# Patient Record
Sex: Male | Born: 1985 | Race: Black or African American | Hispanic: No | Marital: Single | Smoking: Current every day smoker
Health system: Southern US, Community
[De-identification: ages and names within clinical notes are randomized; demographics above are authoritative.]

---

## 2008-02-02 ENCOUNTER — Emergency Department (HOSPITAL_COMMUNITY): Admission: EM | Admit: 2008-02-02 | Discharge: 2008-02-03 | Payer: Self-pay | Admitting: Emergency Medicine

## 2013-09-15 ENCOUNTER — Emergency Department (HOSPITAL_BASED_OUTPATIENT_CLINIC_OR_DEPARTMENT_OTHER)
Admission: EM | Admit: 2013-09-15 | Discharge: 2013-09-15 | Disposition: A | Discharge status: Home / Self Care | Payer: Self-pay | Attending: Emergency Medicine | Admitting: Emergency Medicine

## 2013-09-15 ENCOUNTER — Encounter (HOSPITAL_BASED_OUTPATIENT_CLINIC_OR_DEPARTMENT_OTHER): Payer: Self-pay | Admitting: Emergency Medicine

## 2013-09-15 ENCOUNTER — Emergency Department (HOSPITAL_BASED_OUTPATIENT_CLINIC_OR_DEPARTMENT_OTHER): Payer: Self-pay

## 2013-09-15 DIAGNOSIS — Y9289 Other specified places as the place of occurrence of the external cause: Secondary | ICD-10-CM | POA: Insufficient documentation

## 2013-09-15 DIAGNOSIS — R296 Repeated falls: Secondary | ICD-10-CM | POA: Insufficient documentation

## 2013-09-15 DIAGNOSIS — S6390XA Sprain of unspecified part of unspecified wrist and hand, initial encounter: Secondary | ICD-10-CM | POA: Insufficient documentation

## 2013-09-15 DIAGNOSIS — F17200 Nicotine dependence, unspecified, uncomplicated: Secondary | ICD-10-CM | POA: Insufficient documentation

## 2013-09-15 DIAGNOSIS — S6990XA Unspecified injury of unspecified wrist, hand and finger(s), initial encounter: Secondary | ICD-10-CM | POA: Insufficient documentation

## 2013-09-15 DIAGNOSIS — Y9389 Activity, other specified: Secondary | ICD-10-CM | POA: Insufficient documentation

## 2013-09-15 DIAGNOSIS — S6391XA Sprain of unspecified part of right wrist and hand, initial encounter: Secondary | ICD-10-CM

## 2013-09-15 NOTE — ED Provider Notes (Signed)
CSN: [id number]     Arrival date & time 09/15/13  1838 History   First MD Initiated Contact with Patient 09/15/13 1844     Chief Complaint  Patient presents with  . Hand Injury     (Consider location/radiation/quality/duration/timing/severity/associated sxs/prior Treatment) HPI Comments: Patient is a 28 year old male who presents to the emergency department with his mother complaining of right hand pain x4 days. Patient reports he was at a cookout 4 days ago when he fell onto his right hand. States the pain has been constant, 7/10 and he has noticed slight swelling. No aggravating or alleviating factors. Denies numbness or tingling.  Patient is a 28 y.o. male presenting with hand injury. The history is provided by the patient and a parent.  Hand Injury   History reviewed. No pertinent past medical history. History reviewed. No pertinent past surgical history. History reviewed. No pertinent family history. History  Substance Use Topics  . Smoking status: Current Every Day Smoker -- 1.00 packs/day    Types: Cigarettes  . Smokeless tobacco: Not on file  . Alcohol Use: No    Review of Systems  Constitutional: Negative.   HENT: Negative.   Cardiovascular: Negative.   Musculoskeletal:       + R hand pain and swelling.  Neurological: Negative for numbness.      Allergies  Review of patient's allergies indicates no known allergies.  Home Medications   Prior to Admission medications   Not on File   BP 128/85  Temp(Src) 98 F (36.7 C) (Oral)  Resp 16  Ht 6' (1.829 m)  Wt 160 lb (72.576 kg)  BMI 21.70 kg/m2  SpO2 99% Physical Exam  Nursing note and vitals reviewed. Constitutional: He is oriented to person, place, and time. He appears well-developed and well-nourished. No distress.  HENT:  Head: Normocephalic and atraumatic.  Eyes: Conjunctivae and EOM are normal.  Neck: Normal range of motion. Neck supple.  Cardiovascular: Normal rate, regular rhythm and normal heart  sounds.   Pulmonary/Chest: Effort normal and breath sounds normal.  Musculoskeletal:  Tender to palpation over second and third metacarpal of right hand with mild overlying swelling. Full range of motion. Cap refill < 3 seconds.  Neurological: He is alert and oriented to person, place, and time.  Skin: Skin is warm and dry.  Psychiatric: He has a normal mood and affect. His behavior is normal.    ED Course  Procedures (including critical care time) Labs Review Labs Reviewed - No data to display  Imaging Review Dg Hand Complete Right  09/15/2013   CLINICAL DATA:  Hand injury  EXAM: RIGHT HAND - COMPLETE 3+ VIEW  COMPARISON:  None.  FINDINGS: There is soft tissue swelling about the dorsum of the hand. This finding is without associated fracture or dislocation. Joint spaces are preserved. No erosions. No evidence of chondrocalcinosis. No radiopaque foreign body.  IMPRESSION: Soft tissue swelling about the dorsum of the hand without associated fracture or dislocation.   Electronically Signed   By: Simonne Come M.D.   On: 09/15/2013 19:03     EKG Interpretation None      MDM   Final diagnoses:  Hand sprain, right, initial encounter   Patient's hand pain after fall. Neurovascularly intact. X-ray without any acute finding. Ace wrap applied. Discussed RICE, NSAIDs. Stable for d/c. Return precautions given. Patient states understanding of treatment care plan and is agreeable.   Trevor Mace, PA-C 09/15/13 1926

## 2013-09-15 NOTE — Discharge Instructions (Signed)
You may take ibuprofen, Tylenol or Aleve for pain.  Sprain A sprain is a tear in one of the strong, fibrous tissues that connect your bones (ligaments). The severity of the sprain depends on how much of the ligament is torn. The tear can be either partial or complete. CAUSES  Often, sprains are a result of a fall or an injury. The force of the impact causes the fibers of your ligament to stretch beyond their normal length. This excess tension causes the fibers of your ligament to tear. SYMPTOMS  You may have some loss of motion or increased pain within your normal range of motion. Other symptoms include:  Bruising.  Tenderness.  Swelling. DIAGNOSIS  In order to diagnose a sprain, your caregiver will physically examine you to determine how torn the ligament is. Your caregiver may also suggest an X-ray exam to make sure no bones are broken. TREATMENT  If your ligament is only partially torn, treatment usually involves keeping the injured area in a fixed position (immobilization) for a short period. To do this, your caregiver will apply a bandage, cast, or splint to keep the area from moving until it heals. For a partially torn ligament, the healing process usually takes 2 to 3 weeks. If your ligament is completely torn, you may need surgery to reconnect the ligament to the bone or to reconstruct the ligament. After surgery, a cast or splint may be applied and will need to stay on for 4 to 6 weeks while your ligament heals. HOME CARE INSTRUCTIONS  Keep the injured area elevated to decrease swelling.  To ease pain and swelling, apply ice to your joint twice a day, for 2 to 3 days.  Put ice in a plastic bag.  Place a towel between your skin and the bag.  Leave the ice on for 15 minutes.  Only take over-the-counter or prescription medicine for pain as directed by your caregiver.  Do not leave the injured area unprotected until pain and stiffness go away (usually 3 to 4 weeks).  Do not  allow your cast or splint to get wet. Cover your cast or splint with a plastic bag when you shower or bathe. Do not swim.  Your caregiver may suggest exercises for you to do during your recovery to prevent or limit permanent stiffness. SEEK IMMEDIATE MEDICAL CARE IF:  Your cast or splint becomes damaged.  Your pain becomes worse. MAKE SURE YOU:  Understand these instructions.  Will watch your condition.  Will get help right away if you are not doing well or get worse. Document Released: 12/23/1999 Document Revised: 03/19/2011 Document Reviewed: 01/06/2011 [facility] Patient Information 2015 [place], [place]. This information is not intended to replace advice given to you by your health care provider. Make sure you discuss any questions you have with your health care provider. RICE: Routine Care for Injuries The routine care of many injuries includes Rest, Ice, Compression, and Elevation (RICE). HOME CARE INSTRUCTIONS  Rest is needed to allow your body to heal. Routine activities can usually be resumed when comfortable. Injured tendons and bones can take up to 6 weeks to heal. Tendons are the cord-like structures that attach muscle to bone.  Ice following an injury helps keep the swelling down and reduces pain.  Put ice in a plastic bag.  Place a towel between your skin and the bag.  Leave the ice on for 15-20 minutes, 3-4 times a day, or as directed by your health care provider. Do this while awake,  for the first 24 to 48 hours. After that, continue as directed by your caregiver.  Compression helps keep swelling down. It also gives support and helps with discomfort. If an elastic bandage has been applied, it should be removed and reapplied every 3 to 4 hours. It should not be applied tightly, but firmly enough to keep swelling down. Watch fingers or toes for swelling, bluish discoloration, coldness, numbness, or excessive pain. If any of these problems occur, remove the bandage and  reapply loosely. Contact your caregiver if these problems continue.  Elevation helps reduce swelling and decreases pain. With extremities, such as the arms, hands, legs, and feet, the injured area should be placed near or above the level of the heart, if possible. SEEK IMMEDIATE MEDICAL CARE IF:  You have persistent pain and swelling.  You develop redness, numbness, or unexpected weakness.  Your symptoms are getting worse rather than improving after several days. These symptoms may indicate that further evaluation or further X-rays are needed. Sometimes, X-rays may not show a small broken bone (fracture) until 1 week or 10 days later. Make a follow-up appointment with your caregiver. Ask when your X-ray results will be ready. Make sure you get your X-ray results. Document Released: 04/08/2000 Document Revised: 12/30/2012 Document Reviewed: 05/26/2010 [facility] Patient Information 2015 [place], [place]. This information is not intended to replace advice given to you by your health care provider. Make sure you discuss any questions you have with your health care provider.

## 2013-09-15 NOTE — ED Notes (Signed)
Pt c/o right hand injury x 4 days

## 2013-09-17 NOTE — ED Provider Notes (Signed)
Medical screening examination/treatment/procedure(s) were performed by non-physician practitioner and as supervising physician I was immediately available for consultation/collaboration.   EKG Interpretation None        Matthew Gentry, MD 09/17/13 1235 

## 2016-01-20 ENCOUNTER — Ambulatory Visit (HOSPITAL_BASED_OUTPATIENT_CLINIC_OR_DEPARTMENT_OTHER)
Admission: RE | Admit: 2016-01-20 | Discharge: 2016-01-20 | Disposition: A | Discharge status: Home / Self Care | Payer: PRIVATE HEALTH INSURANCE | Source: Ambulatory Visit | Attending: Emergency Medicine | Admitting: Emergency Medicine

## 2016-01-20 ENCOUNTER — Other Ambulatory Visit (HOSPITAL_BASED_OUTPATIENT_CLINIC_OR_DEPARTMENT_OTHER): Payer: Self-pay | Admitting: Emergency Medicine

## 2016-01-20 ENCOUNTER — Ambulatory Visit (HOSPITAL_BASED_OUTPATIENT_CLINIC_OR_DEPARTMENT_OTHER): Payer: Self-pay

## 2016-01-20 DIAGNOSIS — N5089 Other specified disorders of the male genital organs: Secondary | ICD-10-CM | POA: Diagnosis not present

## 2016-01-20 DIAGNOSIS — N503 Cyst of epididymis: Secondary | ICD-10-CM | POA: Diagnosis not present

## 2016-01-20 DIAGNOSIS — I861 Scrotal varices: Secondary | ICD-10-CM | POA: Insufficient documentation

## 2016-01-20 DIAGNOSIS — N451 Epididymitis: Secondary | ICD-10-CM | POA: Diagnosis not present

## 2016-01-20 DIAGNOSIS — N433 Hydrocele, unspecified: Secondary | ICD-10-CM | POA: Diagnosis present

## 2016-07-30 ENCOUNTER — Encounter (HOSPITAL_BASED_OUTPATIENT_CLINIC_OR_DEPARTMENT_OTHER): Payer: Self-pay

## 2016-07-30 ENCOUNTER — Emergency Department (HOSPITAL_BASED_OUTPATIENT_CLINIC_OR_DEPARTMENT_OTHER): Payer: PRIVATE HEALTH INSURANCE

## 2016-07-30 ENCOUNTER — Emergency Department (HOSPITAL_BASED_OUTPATIENT_CLINIC_OR_DEPARTMENT_OTHER)
Admission: EM | Admit: 2016-07-30 | Discharge: 2016-07-30 | Disposition: A | Discharge status: Home / Self Care | Payer: PRIVATE HEALTH INSURANCE | Attending: Emergency Medicine | Admitting: Emergency Medicine

## 2016-07-30 DIAGNOSIS — S8991XA Unspecified injury of right lower leg, initial encounter: Secondary | ICD-10-CM | POA: Diagnosis present

## 2016-07-30 DIAGNOSIS — Z23 Encounter for immunization: Secondary | ICD-10-CM | POA: Insufficient documentation

## 2016-07-30 DIAGNOSIS — S81011A Laceration without foreign body, right knee, initial encounter: Secondary | ICD-10-CM | POA: Diagnosis not present

## 2016-07-30 DIAGNOSIS — F17210 Nicotine dependence, cigarettes, uncomplicated: Secondary | ICD-10-CM | POA: Insufficient documentation

## 2016-07-30 DIAGNOSIS — Y929 Unspecified place or not applicable: Secondary | ICD-10-CM | POA: Diagnosis not present

## 2016-07-30 DIAGNOSIS — W278XXA Contact with other nonpowered hand tool, initial encounter: Secondary | ICD-10-CM | POA: Diagnosis not present

## 2016-07-30 DIAGNOSIS — Y999 Unspecified external cause status: Secondary | ICD-10-CM | POA: Diagnosis not present

## 2016-07-30 DIAGNOSIS — Y939 Activity, unspecified: Secondary | ICD-10-CM | POA: Insufficient documentation

## 2016-07-30 MED ORDER — TETANUS-DIPHTH-ACELL PERTUSSIS 5-2.5-18.5 LF-MCG/0.5 IM SUSP
0.5000 mL | Freq: Once | INTRAMUSCULAR | Status: AC
  Administered 2016-07-30: 0.5 mL via INTRAMUSCULAR

## 2016-07-30 MED ORDER — LIDOCAINE-EPINEPHRINE 1 %-1:100000 IJ SOLN
INTRAMUSCULAR | Status: AC
  Filled 2016-07-30: qty 1

## 2016-07-30 MED ORDER — IBUPROFEN 800 MG PO TABS
800.0000 mg | ORAL_TABLET | Freq: Once | ORAL | Status: AC
Start: 2016-07-30 — End: 2016-07-30
  Administered 2016-07-30: 800 mg via ORAL
  Filled 2016-07-30: qty 1

## 2016-07-30 MED ORDER — TETANUS-DIPHTH-ACELL PERTUSSIS 5-2.5-18.5 LF-MCG/0.5 IM SUSP
INTRAMUSCULAR | Status: AC
  Filled 2016-07-30: qty 0.5

## 2016-07-30 MED ORDER — LIDOCAINE-EPINEPHRINE (PF) 2 %-1:200000 IJ SOLN
10.0000 mL | Freq: Once | INTRAMUSCULAR | Status: AC
  Administered 2016-07-30: 10 mL

## 2016-07-30 NOTE — ED Triage Notes (Signed)
Pt states he cut right LE with hand saw approx 230pm-presents to triage in w/c-with bloody ace wrap to right knee-NAD

## 2016-07-30 NOTE — Discharge Instructions (Signed)
Please have your sutures removed in 8-10 days. You may return to the emergency department for suture removal, to see her primary care, or follow-up with urgent care. Please keep the wound clean, dry, and covered for the first 24 hours. After that, please clean the wound once daily with warm soap and water. You may apply a topical antibiotic like bacitracin or Neosporin.   If you develop any redness, swelling, or warmth to the area around the wound, or if he develop fever chills, please return to the emergency department for re-evaluation.

## 2016-07-30 NOTE — ED Notes (Signed)
ED Provider at bedside. 

## 2016-07-30 NOTE — ED Notes (Signed)
Patient transported to X-ray 

## 2016-07-30 NOTE — ED Provider Notes (Signed)
[facility] Provider Note   CSN: [id number] Arrival date & time: 07/30/16  1548  By signing my name below, I, Vista Minkobert Ross, attest that this documentation has been prepared under the direction and in the presence of Adaysha Dubinsky PA-C  Electronically Signed: Vista Minkobert Ross, ED Scribe. 07/30/16. 4:19 PM.   History   Chief Complaint Chief Complaint  Patient presents with  . Extremity Laceration    HPI HPI Comments: Willie Hays is a 31 y.o. male who presents to the Emergency Department s/p an injury that occurred around 1430 today. Pt was cutting up some tree limbs in his yard when he swung the handsaw below his torso, accidentally striking the medial aspect of his right knee. There was active bleeding from the wound immediately s/p. Pt bandaged the wound immediately afterwards which was successful in controlling the bleeding. Currently, bleeding is still controlled by same bandage placed prior to arrival. No loss of sensation. He is able to move the toes without difficulty. No h/o of DM. The patient reports he is unsure is his tetanus immunization is UTD.   The history is provided by the patient. No language interpreter was used.    History reviewed. No pertinent past medical history.  There are no active problems to display for this patient.   History reviewed. No pertinent surgical history.   Home Medications    Prior to Admission medications   Not on File    Family History No family history on file.  Social History Social History  Substance Use Topics  . Smoking status: Current Every Day Smoker    Packs/day: 1.00    Types: Cigarettes  . Smokeless tobacco: Never Used  . Alcohol use Yes     Comment: daily     Allergies   Patient has no known allergies.   Review of Systems Review of Systems  Skin: Positive for wound (laceration to medial aspect of right knee).  Neurological: Negative for weakness and numbness.    Physical Exam Updated Vital  Signs BP 131/76 (BP Location: Right Arm)   Pulse 88   Temp 98 F (36.7 C) (Oral)   Resp 18   Ht 6\' 1"  (1.854 m)   Wt 79.4 kg (175 lb)   SpO2 100%   BMI 23.09 kg/m   Physical Exam  Constitutional: He appears well-developed.  HENT:  Head: Normocephalic.  Eyes: Conjunctivae are normal.  Neck: Neck supple.  Cardiovascular: Normal rate and regular rhythm.   No murmur heard. Pulmonary/Chest: Effort normal.  Abdominal: Soft. He exhibits no distension.  Neurological: He is alert.  Skin: Skin is warm and dry.  1.5 cm gaping laceration on the medial superior aspect of the right knee. No obvious foreign bodies. 5/5 strength of bilateral LE. 2+ DP pt pulses. Sensation intact.   Psychiatric: His behavior is normal.  Nursing note and vitals reviewed.    ED Treatments / Results  DIAGNOSTIC STUDIES: Oxygen Saturation is 100% on RA, normal by my interpretation.  COORDINATION OF CARE: 4:17 PM-Discussed treatment plan with pt at bedside and pt agreed to plan.   Labs (all labs ordered are listed, but only abnormal results are displayed) Labs Reviewed - No data to display  EKG  EKG Interpretation None       Radiology Dg Knee 2 Views Right  Result Date: 07/30/2016 CLINICAL DATA:  Laceration to the medial right knee. EXAM: RIGHT KNEE - 1-2 VIEW COMPARISON:  None. FINDINGS: No evidence of fracture, dislocation, or joint effusion. No  evidence of arthropathy. No radiodense foreign body in the soft tissues. There is a 17 mm lesion in the metaphysis of the proximal right tibia, probably representing a benign enchondroma. This is not felt to be significant. IMPRESSION: No acute abnormality. 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 Electronically Signed   By: Francene Boyers M.D.   On: 07/30/2016 16:28    Procedures .Marland KitchenLaceration Repair Date/Time: 07/30/2016 5:20 PM Performed by: Lilian Kapur, Monay Houlton A Authorized by: Frederik Pear A   Consent:    Consent obtained:  Verbal    Consent given by:  Patient Anesthesia (see MAR for exact dosages):    Anesthesia method:  Local infiltration   Local anesthetic:  Lidocaine 1% WITH epi Laceration details:    Location: right knee.   Length (cm):  1.5 Repair type:    Repair type:  Simple Pre-procedure details:    Preparation:  Patient was prepped and draped in usual sterile fashion and imaging obtained to evaluate for foreign bodies Exploration:    Hemostasis achieved with:  Direct pressure   Wound extent: no fascia violation noted, no foreign bodies/material noted, no muscle damage noted, no nerve damage noted, no tendon damage noted, no underlying fracture noted and no vascular damage noted     Contaminated: no   Treatment:    Area cleansed with:  Shur-Clens, saline and Betadine   Amount of cleaning:  Standard   Irrigation solution:  Sterile water   Irrigation method:  Pressure wash Skin repair:    Repair method:  Sutures   Suture size:  3-0   Suture material:  Prolene   Number of sutures:  4 Approximation:    Approximation:  Close Post-procedure details:    Dressing:  Antibiotic ointment and sterile dressing    (including critical care time)  Medications Ordered in ED Medications  lidocaine-EPINEPHrine (XYLOCAINE W/EPI) 1 %-1:100000 (with pres) injection (not administered)  lidocaine-EPINEPHrine (XYLOCAINE W/EPI) 2 %-1:200000 (PF) injection 10 mL (10 mLs Infiltration Given by Other 07/30/16 1615)  ibuprofen (ADVIL,MOTRIN) tablet 800 mg (800 mg Oral Given 07/30/16 1617)  Tdap (BOOSTRIX) injection 0.5 mL (0.5 mLs Intramuscular Given 07/30/16 1616)     Initial Impression / Assessment and Plan / ED Course  I have reviewed the triage vital signs and the nursing notes.  Pertinent labs & imaging results that were available during my care of the patient were reviewed by me and considered in my medical decision making (see chart for details).     No underlying fractures or radiolucent FB on X-ray. Tdap booster  given. Pressure irrigation performed. Laceration occurred < 8 hours prior to repair which was well tolerated. Pt has no co morbidities to effect normal wound healing. Discussed suture home care w pt and answered questions. Pt to f-u for wound check and suture removal in 8-10 days. Strict return precautions given. Pt is hemodynamically stable w no complaints prior to dc.    Final Clinical Impressions(s) / ED Diagnoses    Final diagnoses:  Knee laceration, right, initial encounter    New Prescriptions New Prescriptions   No medications on file  I personally performed the services described in this documentation, which was scribed in my presence. The recorded information has been reviewed and is accurate.    Frederik Pear A, PA-C 07/30/16 1724    Rolan Bucco, MD 07/30/16 [phone]

## 2017-09-19 ENCOUNTER — Other Ambulatory Visit: Payer: Self-pay

## 2017-09-19 ENCOUNTER — Encounter (HOSPITAL_BASED_OUTPATIENT_CLINIC_OR_DEPARTMENT_OTHER): Payer: Self-pay

## 2017-09-19 ENCOUNTER — Emergency Department (HOSPITAL_BASED_OUTPATIENT_CLINIC_OR_DEPARTMENT_OTHER)
Admission: EM | Admit: 2017-09-19 | Discharge: 2017-09-19 | Disposition: A | Discharge status: Home / Self Care | Payer: BLUE CROSS/BLUE SHIELD | Attending: Emergency Medicine | Admitting: Emergency Medicine

## 2017-09-19 ENCOUNTER — Emergency Department (HOSPITAL_BASED_OUTPATIENT_CLINIC_OR_DEPARTMENT_OTHER): Payer: BLUE CROSS/BLUE SHIELD

## 2017-09-19 DIAGNOSIS — S022XXA Fracture of nasal bones, initial encounter for closed fracture: Secondary | ICD-10-CM | POA: Insufficient documentation

## 2017-09-19 DIAGNOSIS — F17210 Nicotine dependence, cigarettes, uncomplicated: Secondary | ICD-10-CM | POA: Insufficient documentation

## 2017-09-19 DIAGNOSIS — W500XXA Accidental hit or strike by another person, initial encounter: Secondary | ICD-10-CM | POA: Insufficient documentation

## 2017-09-19 DIAGNOSIS — S0993XA Unspecified injury of face, initial encounter: Secondary | ICD-10-CM | POA: Diagnosis present

## 2017-09-19 DIAGNOSIS — Y92310 Basketball court as the place of occurrence of the external cause: Secondary | ICD-10-CM | POA: Insufficient documentation

## 2017-09-19 DIAGNOSIS — Y9367 Activity, basketball: Secondary | ICD-10-CM | POA: Insufficient documentation

## 2017-09-19 DIAGNOSIS — Y998 Other external cause status: Secondary | ICD-10-CM | POA: Diagnosis not present

## 2017-09-19 NOTE — ED Triage Notes (Signed)
Pt c/o elbow to nose playing basketball 9/7-NAD-steady gait

## 2017-09-19 NOTE — ED Provider Notes (Signed)
[facility] EMERGENCY DEPARTMENT Provider Note   CSN: [id number] Arrival date & time: 09/19/17  1103     History   Chief Complaint Chief Complaint  Patient presents with  . Facial Injury    HPI Willie Hays is a 32 y.o. male.  HPI  32 year old male presents for evaluation of possible nose fracture.  On 9/7 he was elbowed in the face while playing basketball.  No headache.  He has had some intermittent nosebleeding since and his nose remains a little bit swollen and painful.  Pain is rated as a 5.  History reviewed. No pertinent past medical history.  There are no active problems to display for this patient.   History reviewed. No pertinent surgical history.      Home Medications    Prior to Admission medications   Not on File    Family History No family history on file.  Social History Social History   Tobacco Use  . Smoking status: Current Every Day Smoker    Packs/day: 1.00    Types: Cigarettes  . Smokeless tobacco: Never Used  Substance Use Topics  . Alcohol use: Yes    Comment: daily  . Drug use: No     Allergies   Patient has no known allergies.   Review of Systems Review of Systems  HENT: Positive for facial swelling and nosebleeds.   Neurological: Negative for headaches.     Physical Exam Updated Vital Signs BP 128/84 (BP Location: Left Arm)   Pulse 98   Temp 98.6 F (37 C) (Oral)   Resp 18   Ht 6' (1.829 m)   Wt 73 kg   SpO2 98%   BMI 21.84 kg/m   Physical Exam  Constitutional: He is oriented to person, place, and time. He appears well-developed and well-nourished. No distress.  HENT:  Head: Normocephalic.    Right Ear: External ear normal.  Left Ear: External ear normal.  Nose: Sinus tenderness present. No septal deviation or nasal septal hematoma. No epistaxis.    Eyes: Right eye exhibits no discharge. Left eye exhibits no discharge.  Neck: Neck supple.  Pulmonary/Chest: Effort normal.  Abdominal: He  exhibits no distension.  Neurological: He is alert and oriented to person, place, and time.  Skin: Skin is warm and dry. He is not diaphoretic.  Nursing note and vitals reviewed.    ED Treatments / Results  Labs (all labs ordered are listed, but only abnormal results are displayed) Labs Reviewed - No data to display  EKG None  Radiology Dg Nasal Bones  Result Date: 09/19/2017 CLINICAL DATA:  Injured playing basketball, hit in the nose 4 days ago EXAM: NASAL BONES - 3+ VIEW COMPARISON:  None. FINDINGS: There is a nondisplaced nasal bone fracture present. No other acute maxillofacial abnormality is seen. No periorbital air is noted and the maxillary sinuses appear clear. IMPRESSION: Nondisplaced nasal bone fracture. Electronically Signed   By: Dwyane DeePaul  Barry M.D.   On: 09/19/2017 11:36    Procedures Procedures (including critical care time)  Medications Ordered in ED Medications - No data to display   Initial Impression / Assessment and Plan / ED Course  I have reviewed the triage vital signs and the nursing notes.  Pertinent labs & imaging results that were available during my care of the patient were reviewed by me and considered in my medical decision making (see chart for details).     Xray shows nondisplaced fracture. No septal hematoma. Well appearing. No headache/concussion/head  injury symptoms. Offered ENT follow up but he declines and states he won't follow up, only came because mom wanted him to. Offered pain meds, declines. D/c home with return precautions.  Final Clinical Impressions(s) / ED Diagnoses   Final diagnoses:  Closed fracture of nasal bone, initial encounter    ED Discharge Orders    None       Pricilla Loveless, MD 09/19/17 1310

## 2017-09-19 NOTE — ED Notes (Signed)
Patient transported to X-ray 

## 2017-09-19 NOTE — ED Notes (Signed)
Pt/family verbalized understanding of discharge instructions.   

## 2018-02-03 IMAGING — CR DG KNEE 1-2V*R*
2 series · 2 of 2 positions shown · non-contrast
Comparison: None.

CLINICAL DATA: Laceration to the medial right knee.

EXAM:
RIGHT KNEE - 1-2 VIEW

[t knee ap right]
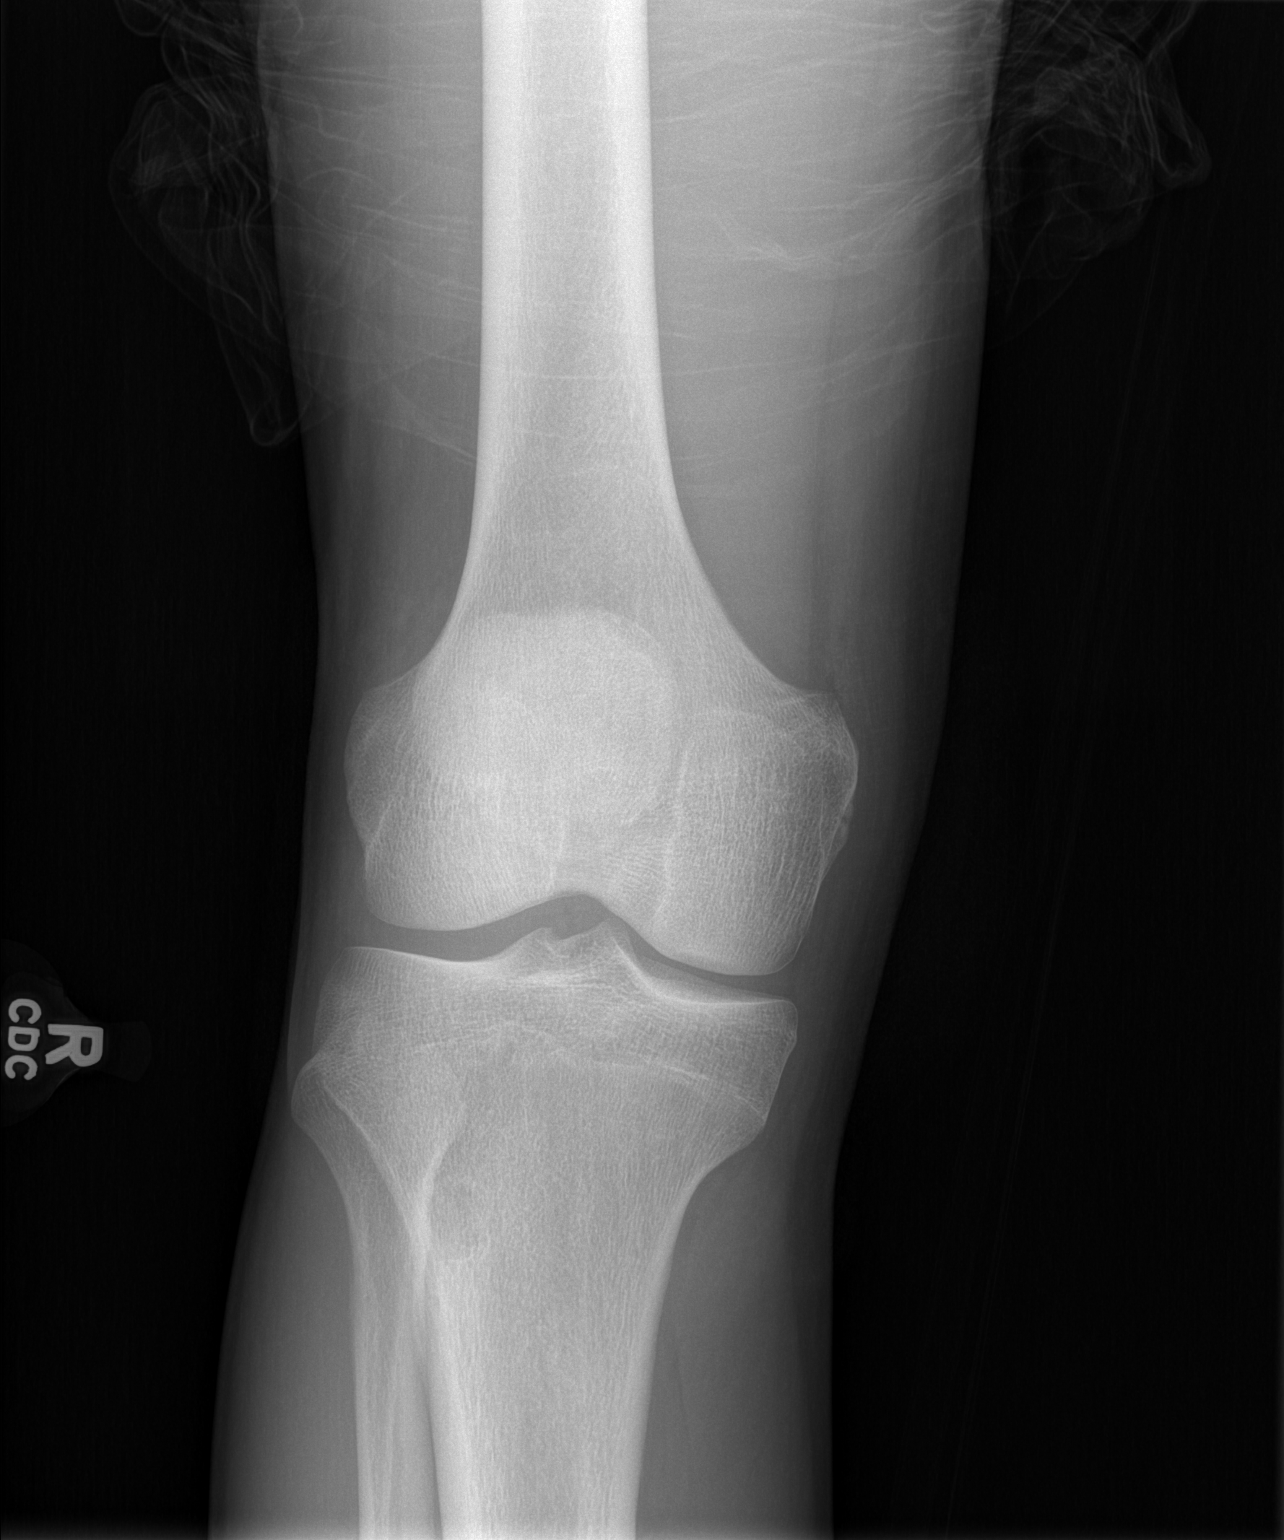

[t knee lat right]
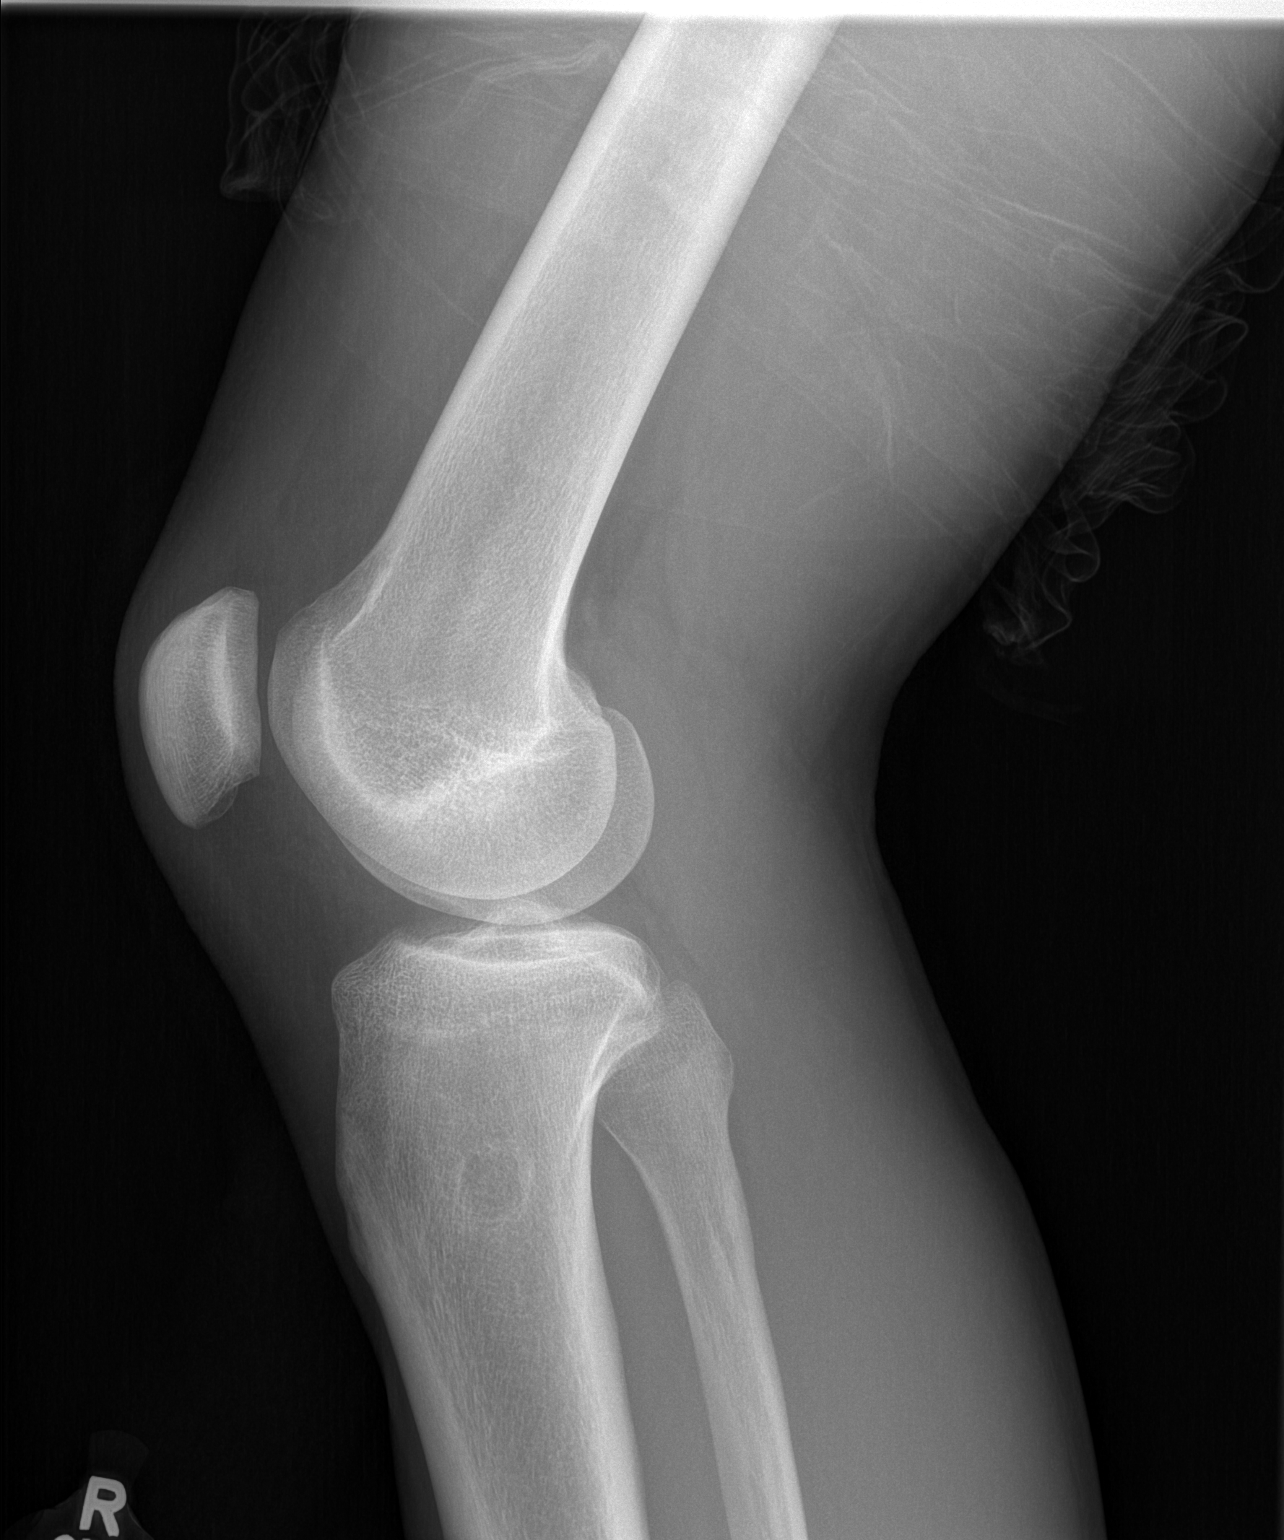

[2 of 2 positions shown; findings below may reference images not displayed]

FINDINGS: No evidence of fracture, dislocation, or joint effusion. No evidence
of arthropathy. No radiodense foreign body in the soft tissues.

There is a 17 mm lesion in the metaphysis of the proximal right
tibia, probably representing a benign enchondroma. This is not felt
to be significant.
IMPRESSION: No acute abnormality. Probable small enchondroma in the proximal
right tibia.

## 2018-04-16 ENCOUNTER — Emergency Department (HOSPITAL_BASED_OUTPATIENT_CLINIC_OR_DEPARTMENT_OTHER)
Admission: EM | Admit: 2018-04-16 | Discharge: 2018-04-16 | Disposition: A | Discharge status: Other Acute Inpatient Hospital | Payer: No Typology Code available for payment source | Attending: Emergency Medicine | Admitting: Emergency Medicine

## 2018-04-16 ENCOUNTER — Emergency Department (HOSPITAL_BASED_OUTPATIENT_CLINIC_OR_DEPARTMENT_OTHER): Payer: No Typology Code available for payment source

## 2018-04-16 ENCOUNTER — Other Ambulatory Visit: Payer: Self-pay

## 2018-04-16 DIAGNOSIS — X17XXXA Contact with hot engines, machinery and tools, initial encounter: Secondary | ICD-10-CM | POA: Diagnosis not present

## 2018-04-16 DIAGNOSIS — Y990 Civilian activity done for income or pay: Secondary | ICD-10-CM | POA: Insufficient documentation

## 2018-04-16 DIAGNOSIS — Y9389 Activity, other specified: Secondary | ICD-10-CM | POA: Insufficient documentation

## 2018-04-16 DIAGNOSIS — Y9263 Factory as the place of occurrence of the external cause: Secondary | ICD-10-CM | POA: Insufficient documentation

## 2018-04-16 DIAGNOSIS — T23341A Burn of third degree of multiple right fingers (nail), including thumb, initial encounter: Secondary | ICD-10-CM | POA: Diagnosis not present

## 2018-04-16 DIAGNOSIS — T23321A Burn of third degree of single right finger (nail) except thumb, initial encounter: Secondary | ICD-10-CM

## 2018-04-16 DIAGNOSIS — F17210 Nicotine dependence, cigarettes, uncomplicated: Secondary | ICD-10-CM | POA: Insufficient documentation

## 2018-04-16 DIAGNOSIS — S6991XA Unspecified injury of right wrist, hand and finger(s), initial encounter: Secondary | ICD-10-CM | POA: Diagnosis present

## 2018-04-16 MED ORDER — ACETAMINOPHEN 500 MG PO TABS
1000.0000 mg | ORAL_TABLET | Freq: Once | ORAL | Status: AC
  Administered 2018-04-16: 15:00:00 1000 mg via ORAL
  Filled 2018-04-16: qty 2

## 2018-04-16 MED ORDER — TETANUS-DIPHTH-ACELL PERTUSSIS 5-2.5-18.5 LF-MCG/0.5 IM SUSP
0.5000 mL | Freq: Once | INTRAMUSCULAR | Status: DC
  Filled 2018-04-16: qty 0.5

## 2018-04-16 NOTE — ED Provider Notes (Signed)
[facility] EMERGENCY DEPARTMENT Provider Note   CSN: [id number] Arrival date & time: 04/16/18  1357    History   Chief Complaint Chief Complaint  Patient presents with  . Laceration  . Finger Injury    HPI Willie Hays is a 33 y.o. male.     HPI   Patient is a 33 year old male with no significant past medical history presenting for heat knife injury to his second third digits of his right hand.  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  Patient reports that his hand slipped under the path of the knife while it was not cutting bubble wrap.  Patient reports that the sensor sensed his arm, however the machine had to be manually turned off before he could extract his fingers.  Patient reports that there may have been some burnt pieces of plastic on the heat knife, however he was not wearing gloves and no other materials were burnt into his hand.  Patient ports that he has some pain and throbbing distally to the fingers.  He reports that he cannot extend the right index finger fully due to the injury.  Unknown last tetanus shot.  Patient is right-handed.  No past medical history on file.  There are no active problems to display for this patient.   No past surgical history on file.      Home Medications    Prior to Admission medications   Not on File    Family History No family history on file.  Social History Social History   Tobacco Use  . Smoking status: Current Every Day Smoker    Packs/day: 1.00    Types: Cigarettes  . Smokeless tobacco: Never Used  Substance Use Topics  . Alcohol use: Yes    Comment: daily  . Drug use: No     Allergies   Patient has no known allergies.   Review of Systems Review of Systems  Constitutional: Negative for chills and fever.  Musculoskeletal: Positive for arthralgias and myalgias.  Skin: Positive for wound. Negative for color  change.  Neurological: Negative for weakness and numbness.  All other systems reviewed and are negative.    Physical Exam Updated Vital Signs BP (!) 152/99 (BP Location: Left Arm)   Pulse 77   Temp 98.1 F (36.7 C) (Oral)   Resp 16   Ht 6' (1.829 m)   Wt 79.4 kg   SpO2 100%   BMI 23.73 kg/m   Physical Exam Vitals signs and nursing note reviewed.  Constitutional:      General: He is not in acute distress.    Appearance: He is well-developed. He is not diaphoretic.     Comments: Sitting comfortably in bed.  HENT:     Head: Normocephalic and atraumatic.  Eyes:     General:        Right eye: No discharge.        Left eye: No discharge.     Conjunctiva/sclera: Conjunctivae normal.     Comments: EOMs normal to gross examination.  Neck:     Musculoskeletal: Normal range of motion.  Cardiovascular:     Rate and Rhythm: Normal rate and regular rhythm.     Comments: Intact, 2+ right radial and ulnar pulse. Pulmonary:     Comments: Patient converses comfortably without audible wheeze or stridor. Abdominal:     General: There is no distension.  Musculoskeletal:        General: Deformity present.     Comments: See clinical photo for details.  Patient has a deep deformity just distal to the nail matrix of the right index finger.  Appears to depress down to bone.  Patient has a deep, heat induced defect with charred tissue in the nailbed of the right long finger.  Patient has loss of flexion and extension of the DIP joint of the right index finger.  Sensation to sharp touch intact in the distal pads of index and long finger of the right hand.  Patient has intact two-point discrimination in the pads of the fingers of the right index and long finger.  Skin:    General: Skin is warm and dry.  Neurological:     Mental Status: He is alert.     Comments: Cranial nerves intact to gross observation. Patient moves extremities without difficulty.  Psychiatric:        Behavior: Behavior  normal.        Thought Content: Thought content normal.        Judgment: Judgment normal.        ED Treatments / Results  Labs (all labs ordered are listed, but only abnormal results are displayed) Labs Reviewed - No data to display  EKG None  Radiology No results found.  Procedures Procedures (including critical care time)  Medications Ordered in ED Medications  Tdap (BOOSTRIX) injection 0.5 mL (has no administration in time range)  acetaminophen (TYLENOL) tablet 1,000 mg (has no administration in time range)     Initial Impression / Assessment and Plan / ED Course  I have reviewed the triage vital signs and the nursing notes.  Pertinent labs & imaging results that were available during my care of the patient were reviewed by me and considered in my medical decision making (see chart for details).  Clinical Course as of Apr 15 1621  Wed Apr 16, 2018  1541 Spoke with Dr. Fredric Mare who recommends POV transfer to [facility] for further evaluation. Appreciate his involvement.    [AM]    Clinical Course User Index [AM] Elisha Ponder, PA-C      Patient nontoxic-appearing and neurovascularly intact in the right index and long finger.  Patient does have extensive tissue defect in the DIP joint of the right hand with loss of flexion extension of the DIP joint as well as evidence of third-degree burn.  Patient has defect of the nailbed of the right index finger.  Radiographs are demonstrating no evidence of bony involvement.  Case initially discussed with Dr. Izora Ribas of hand surgery who recommended discussing case with [facility] burn center.  I appreciate his involvement.  Dr. Fredric Mare of burn center [facility] was contacted and recommend transfer to the emergency department at [facility].  They will evaluate and treat with revision as needed.  I appreciate his involvement.  Patient placed in sterile saline dressing and  splint prior to leaving.  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  This is a shared visit with Dr. Arby Barrette. Patient was independently evaluated by this attending physician. Attending physician consulted in evaluation and transfer management. EMTALA completed for transfer.   Final Clinical Impressions(s) / ED Diagnoses   Final diagnoses:  Full thickness burn of finger of right hand, initial encounter    ED Discharge Orders    None       Aviva Kluver B,  PA-C 04/16/18 1624    Arby BarrettePfeiffer, Marcy, MD 04/24/18 0008

## 2018-04-16 NOTE — ED Notes (Signed)
Report to Inetta Fermo, Consulting civil engineer at [facility] ED.

## 2018-04-16 NOTE — Discharge Instructions (Signed)
Please go directly to [facility] emergency department as listed in the address above.  They will be expecting you.  Dr. Fredric Mare will be the burn physician expecting you.   Thank you for allowing [place] to participate in your care today.

## 2018-04-16 NOTE — ED Notes (Signed)
Pt arrives with supervisor Tresa Endo) as it was a work related injury.  Pt went to urgent care for evaluation initially, but sent here due to nature of the injury needing higher level of care.  Per supervisor need urine drug test and breathylizer, Patient states he provided urine specimen while at urgent care, Supervisor states needs both here today.

## 2018-09-22 ENCOUNTER — Other Ambulatory Visit: Payer: Self-pay | Admitting: *Deleted

## 2018-09-22 DIAGNOSIS — Z20822 Contact with and (suspected) exposure to covid-19: Secondary | ICD-10-CM

## 2018-09-23 LAB — NOVEL CORONAVIRUS, NAA: SARS-CoV-2, NAA: NOT DETECTED

## 2018-09-24 ENCOUNTER — Telehealth: Payer: Self-pay | Admitting: General Practice

## 2018-09-24 NOTE — Telephone Encounter (Signed)
Pt aware covid lab test negative, not detected °

## 2019-03-26 IMAGING — CR DG NASAL BONES 3+V
3 series · 3 of 3 positions shown · non-contrast
Comparison: None.

CLINICAL DATA: Injured playing basketball, hit in the nose 4 days
ago

EXAM:
NASAL BONES - 3+ VIEW

[w waters *]
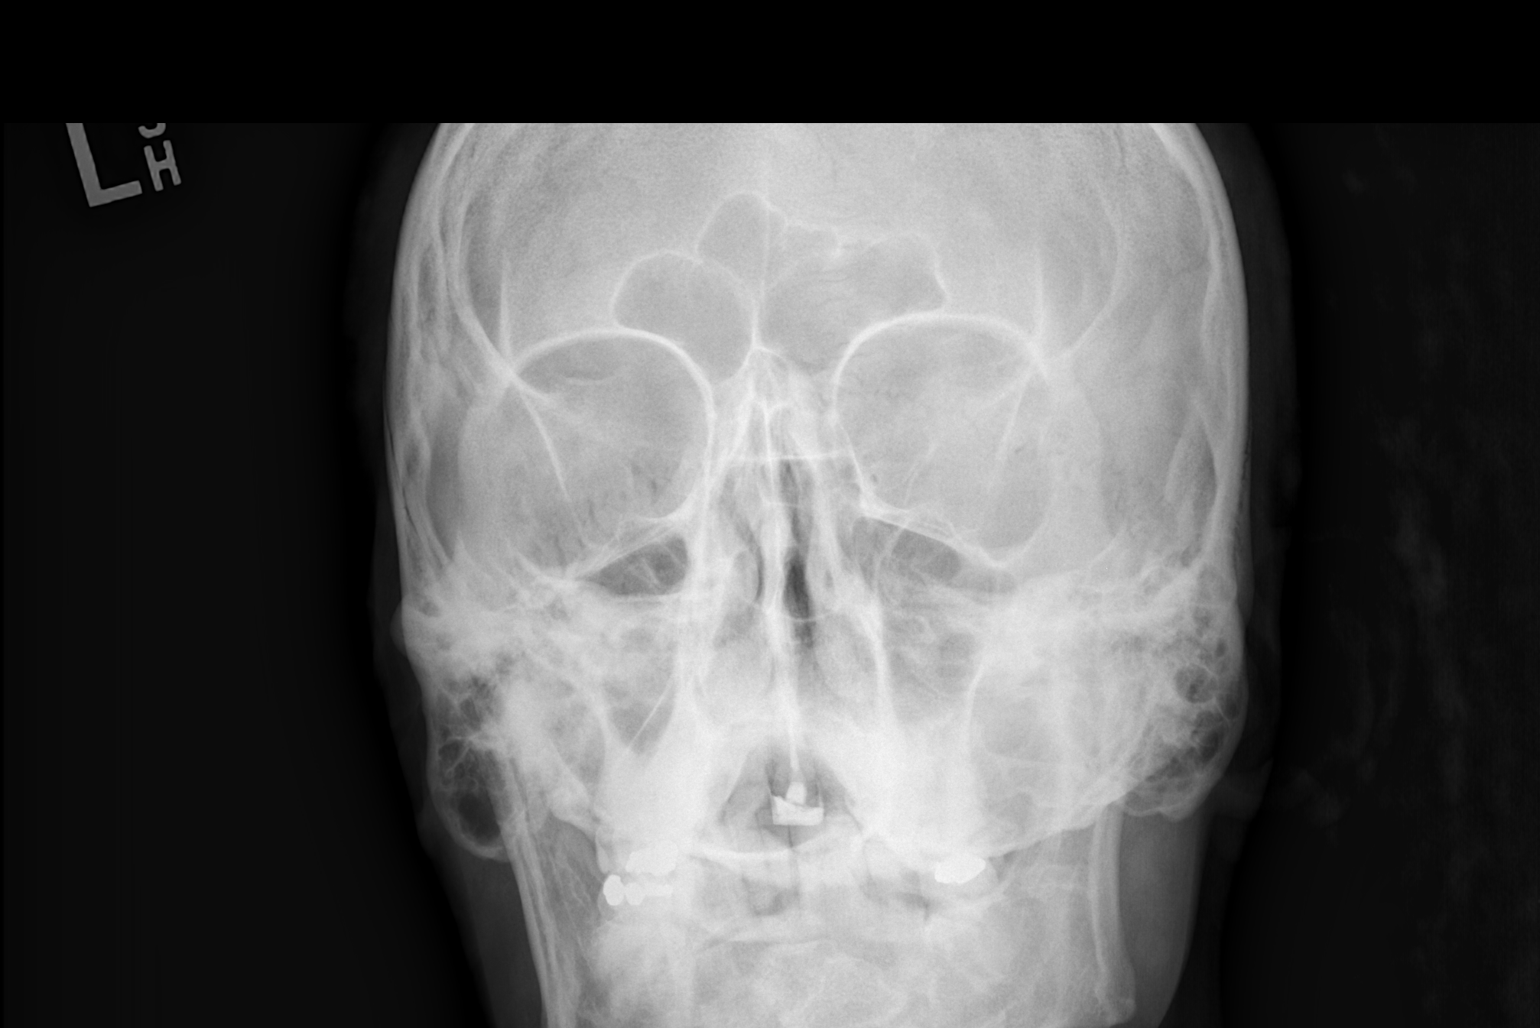

[w nasal bone lat * (1 of 2)]
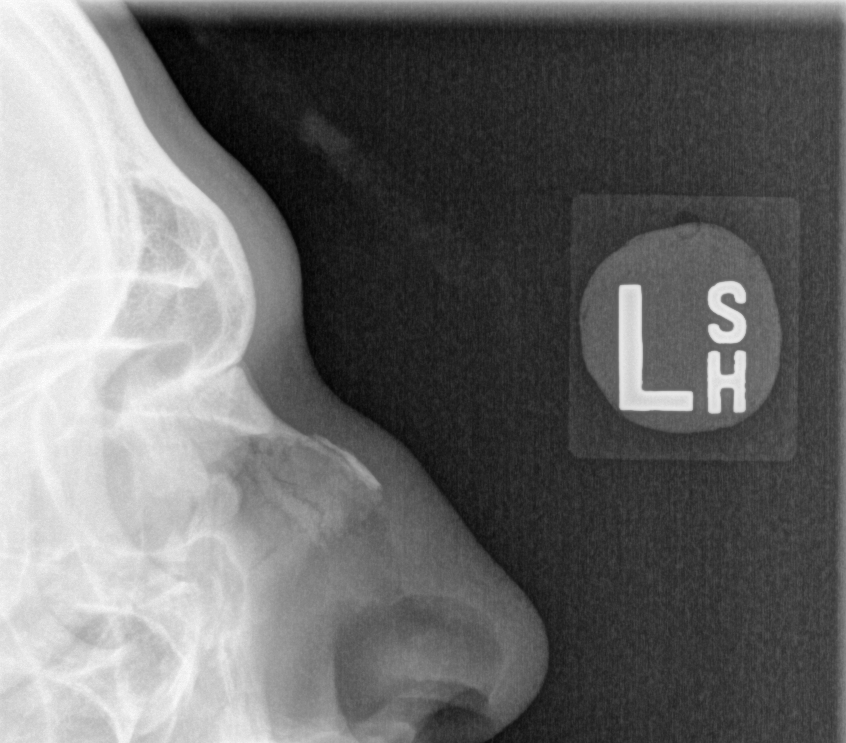

[w nasal bone lat * (2 of 2)]
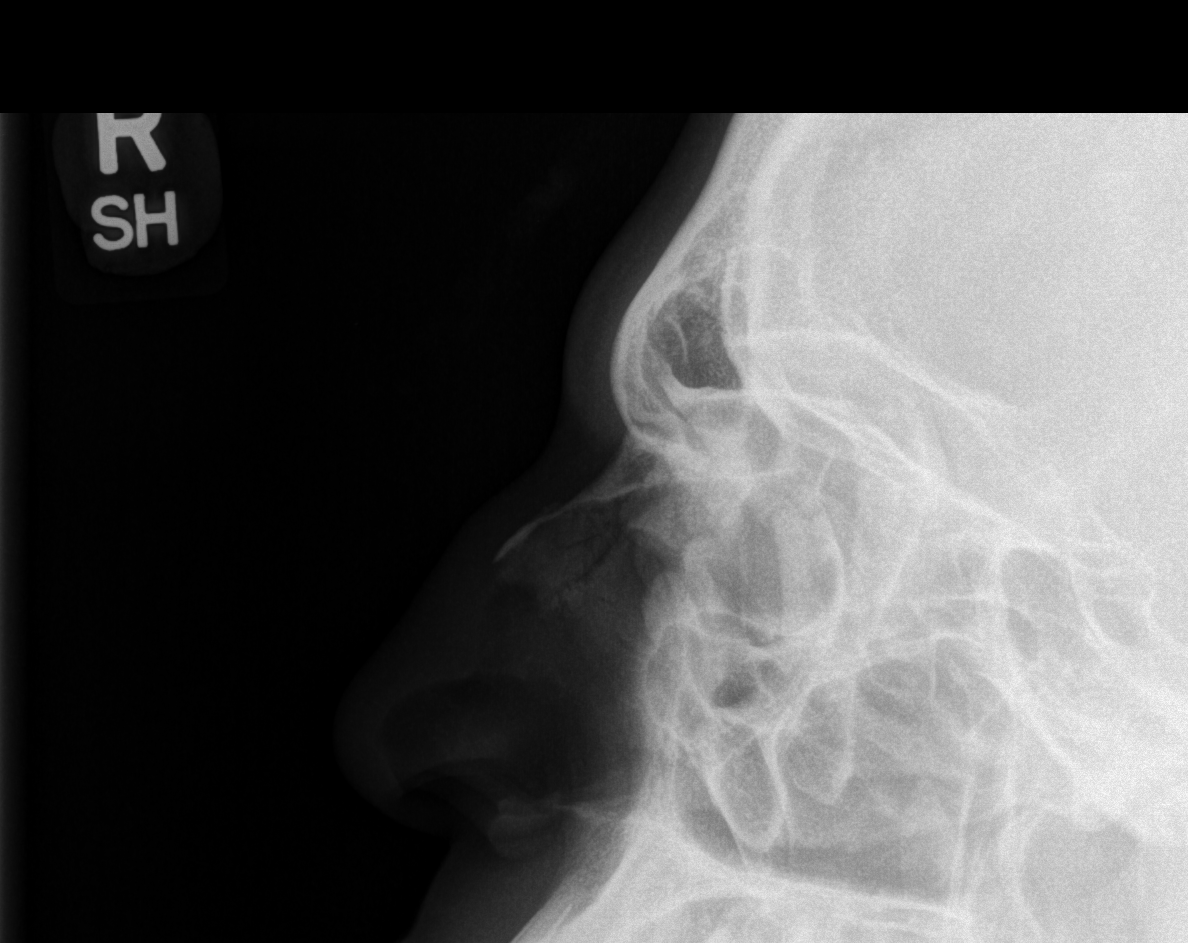

[3 of 3 positions shown; findings below may reference images not displayed]

FINDINGS: There is a nondisplaced nasal bone fracture present. No other acute
maxillofacial abnormality is seen. No periorbital air is noted and
the maxillary sinuses appear clear.
IMPRESSION: Nondisplaced nasal bone fracture.
